# Patient Record
Sex: Male | Born: 2000 | Race: Black or African American | Hispanic: No | Marital: Single | Smoking: Never smoker
Health system: Southern US, Community
[De-identification: ages and names within clinical notes are randomized; demographics above are authoritative.]

---
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
## 2022-05-10 IMAGING — DX DG CHEST 1V
1 series · 1 of 1 positions shown · non-contrast
Comparison: None.

CLINICAL DATA: Chest pain, abdominal and sub axillary abscesses

EXAM:
CHEST  1 VIEW

[chest]
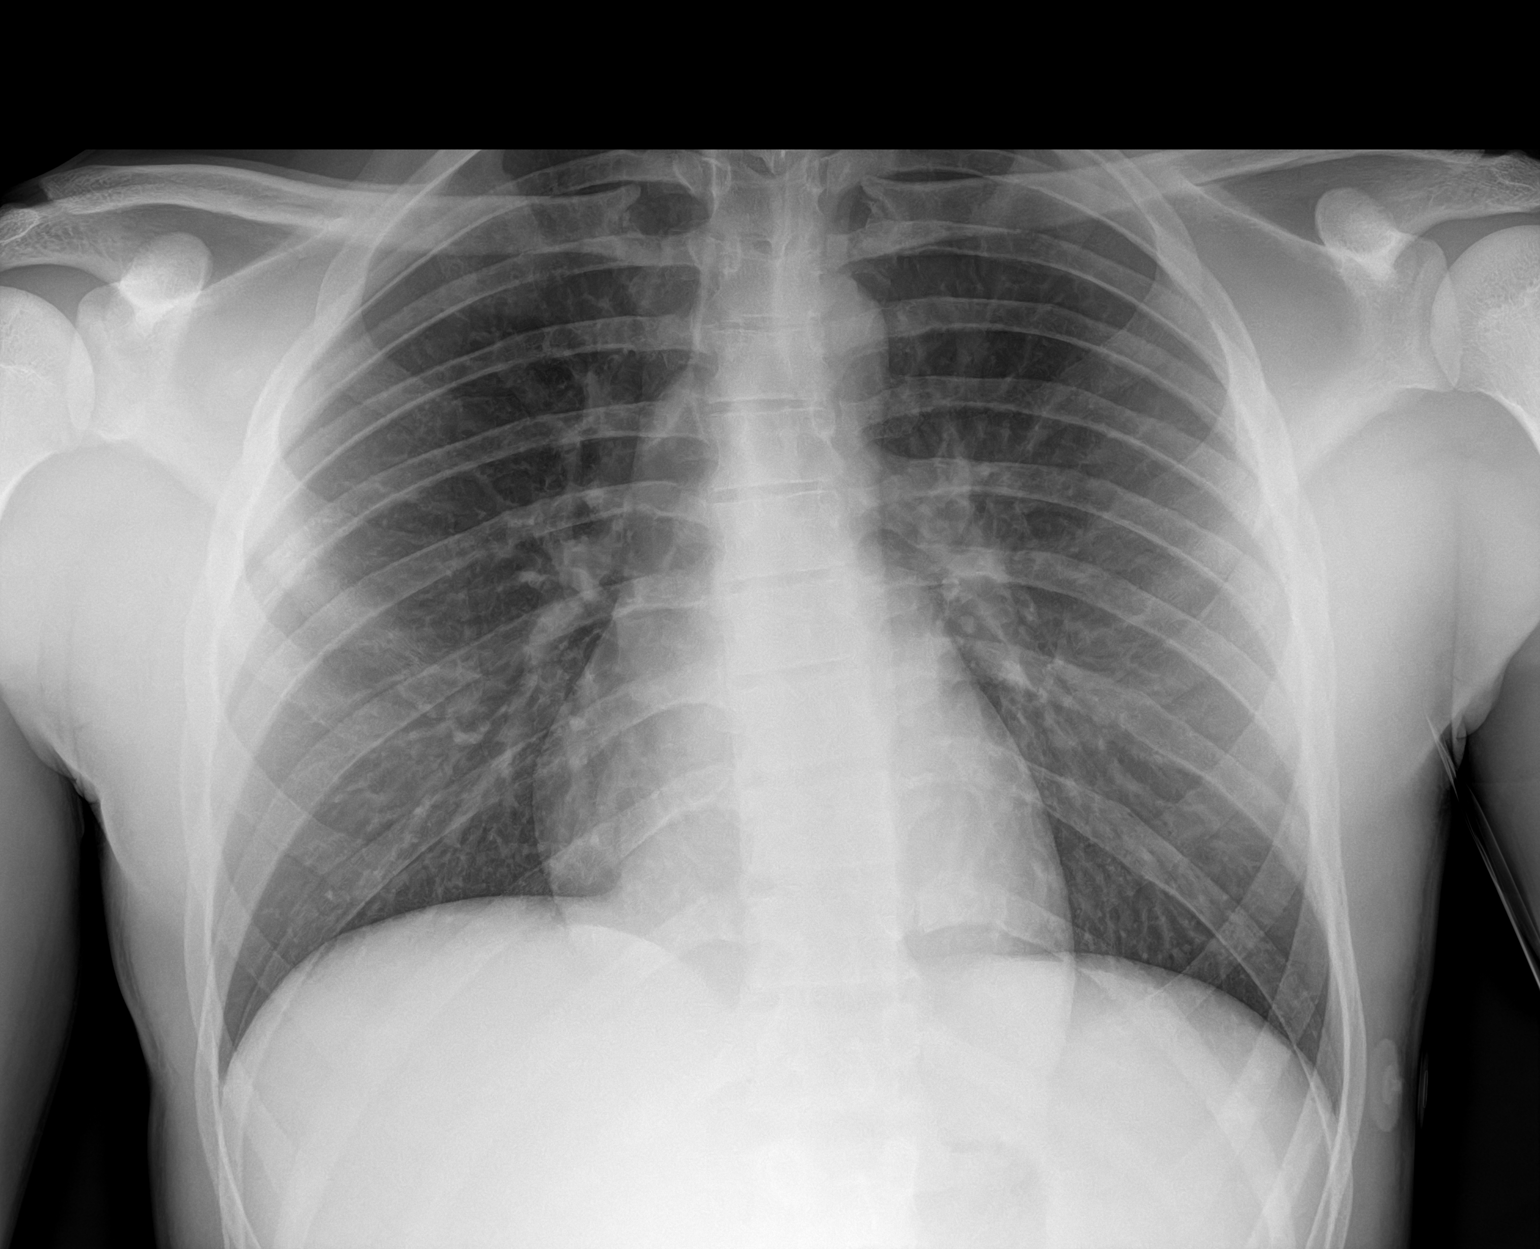

[1 of 1 positions shown; findings below may reference images not displayed]

FINDINGS: Nonspecific soft tissue fullness in the axillary regions without
soft tissue gas or discernible mass or foreign body. Remaining
osseous structures and soft tissues are unremarkable. No
consolidation, features of edema, pneumothorax, or effusion. The
cardiomediastinal contours are unremarkable.
IMPRESSION: 1. Nonspecific soft tissue fullness in the axillary regions,
correlate with reported clinical findings of abscess.
2. No acute cardiopulmonary abnormality.
